# Patient Record
Sex: Female | Born: 1990 | Hispanic: Yes | Marital: Single | Smoking: Never smoker
Health system: Southern US, Community
[De-identification: ages and names within clinical notes are randomized; demographics above are authoritative.]

## PROBLEM LIST (undated history)

## (undated) ENCOUNTER — Inpatient Hospital Stay: Payer: Self-pay

## (undated) HISTORY — PX: NO PAST SURGERIES: SHX2092

---
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
## 2008-11-06 ENCOUNTER — Emergency Department: Payer: Self-pay | Admitting: Unknown Physician Specialty

## 2011-05-26 ENCOUNTER — Emergency Department: Payer: Self-pay | Admitting: Emergency Medicine

## 2012-12-07 ENCOUNTER — Emergency Department: Payer: Self-pay | Admitting: Emergency Medicine

## 2012-12-07 LAB — BASIC METABOLIC PANEL
Anion Gap: 9 (ref 7–16)
BUN: 9 mg/dL (ref 7–18)
Calcium, Total: 9.1 mg/dL (ref 8.5–10.1)
Co2: 25 mmol/L (ref 21–32)
Creatinine: 0.49 mg/dL — ABNORMAL LOW (ref 0.60–1.30)
EGFR (African American): >60
EGFR (Non-African Amer.): >60
Glucose: 96 mg/dL (ref 65–99)
Osmolality: 274 (ref 275–301)
Potassium: 3.3 mmol/L — ABNORMAL LOW (ref 3.5–5.1)
Sodium: 138 mmol/L (ref 136–145)

## 2012-12-07 LAB — CBC WITH DIFFERENTIAL/PLATELET
Basophil #: 0 10*3/uL (ref 0.0–0.1)
Basophil %: 0.4 %
Eosinophil %: 0.3 %
HCT: 35.9 % (ref 35.0–47.0)
Lymphocyte %: 18.9 %
MCHC: 34.9 g/dL (ref 32.0–36.0)
Monocyte #: 0.6 x10 3/mm (ref 0.2–0.9)
Monocyte %: 4.9 %
Neutrophil %: 75.5 %
RBC: 3.99 10*6/uL (ref 3.80–5.20)
RDW: 12.8 % (ref 11.5–14.5)

## 2012-12-08 LAB — URINALYSIS, COMPLETE
Bilirubin,UR: NEGATIVE
Leukocyte Esterase: NEGATIVE
Nitrite: NEGATIVE
Protein: NEGATIVE
Specific Gravity: 1.009 (ref 1.003–1.030)
Squamous Epithelial: <1
WBC UR: <1 /HPF (ref 0–5)

## 2012-12-08 LAB — MONONUCLEOSIS SCREEN: Mono Test: NEGATIVE

## 2012-12-08 LAB — RAPID INFLUENZA A&B ANTIGENS

## 2014-06-22 ENCOUNTER — Emergency Department: Payer: Self-pay | Admitting: Emergency Medicine

## 2014-06-22 LAB — CBC WITH DIFFERENTIAL/PLATELET
Basophil #: 0 10*3/uL (ref 0.0–0.1)
Basophil %: 0.3 %
Eosinophil #: 0.2 10*3/uL (ref 0.0–0.7)
Eosinophil %: 1.6 %
HCT: 36 % (ref 35.0–47.0)
HGB: 11.9 g/dL — AB (ref 12.0–16.0)
LYMPHS ABS: 3.5 10*3/uL (ref 1.0–3.6)
Lymphocyte %: 30.6 %
MCH: 29.3 pg (ref 26.0–34.0)
MCHC: 33.1 g/dL (ref 32.0–36.0)
MCV: 88 fL (ref 80–100)
Monocyte #: 0.6 x10 3/mm (ref 0.2–0.9)
Monocyte %: 5.7 %
Neutrophil #: 7.1 10*3/uL — ABNORMAL HIGH (ref 1.4–6.5)
Neutrophil %: 61.8 %
Platelet: 390 10*3/uL (ref 150–440)
RBC: 4.08 10*6/uL (ref 3.80–5.20)
RDW: 14.4 % (ref 11.5–14.5)
WBC: 11.4 10*3/uL — ABNORMAL HIGH (ref 3.6–11.0)

## 2014-06-22 LAB — URINALYSIS, COMPLETE
BACTERIA: NONE SEEN
BLOOD: NEGATIVE
Bilirubin,UR: NEGATIVE
GLUCOSE, UR: NEGATIVE mg/dL (ref 0–75)
Ketone: NEGATIVE
Leukocyte Esterase: NEGATIVE
NITRITE: NEGATIVE
PROTEIN: NEGATIVE
Ph: 6 (ref 4.5–8.0)
RBC,UR: 1 /HPF (ref 0–5)
Specific Gravity: 1.012 (ref 1.003–1.030)
Squamous Epithelial: 3
WBC UR: <1 /HPF (ref 0–5)

## 2014-06-22 LAB — COMPREHENSIVE METABOLIC PANEL
ALBUMIN: 4 g/dL (ref 3.4–5.0)
Alkaline Phosphatase: 72 U/L
Anion Gap: 6 — ABNORMAL LOW (ref 7–16)
BUN: 9 mg/dL (ref 7–18)
Bilirubin,Total: 0.4 mg/dL (ref 0.2–1.0)
CALCIUM: 8.6 mg/dL (ref 8.5–10.1)
Chloride: 104 mmol/L (ref 98–107)
Co2: 26 mmol/L (ref 21–32)
Creatinine: 0.5 mg/dL — ABNORMAL LOW (ref 0.60–1.30)
EGFR (African American): >60
Glucose: 89 mg/dL (ref 65–99)
OSMOLALITY: 270 (ref 275–301)
Potassium: 3.5 mmol/L (ref 3.5–5.1)
SGOT(AST): 19 U/L (ref 15–37)
SGPT (ALT): 24 U/L
Sodium: 136 mmol/L (ref 136–145)
Total Protein: 8.1 g/dL (ref 6.4–8.2)

## 2016-04-20 ENCOUNTER — Other Ambulatory Visit: Payer: Self-pay | Admitting: Obstetrics and Gynecology

## 2016-04-20 DIAGNOSIS — Z369 Encounter for antenatal screening, unspecified: Secondary | ICD-10-CM

## 2016-05-04 ENCOUNTER — Other Ambulatory Visit: Payer: Self-pay | Admitting: Obstetrics and Gynecology

## 2016-05-04 ENCOUNTER — Encounter: Payer: Self-pay | Admitting: *Deleted

## 2016-05-04 ENCOUNTER — Ambulatory Visit
Admission: RE | Admit: 2016-05-04 | Discharge: 2016-05-04 | Disposition: A | Discharge status: Home / Self Care | Payer: Medicaid Other | Source: Ambulatory Visit | Attending: Obstetrics and Gynecology | Admitting: Obstetrics and Gynecology

## 2016-05-04 DIAGNOSIS — O283 Abnormal ultrasonic finding on antenatal screening of mother: Secondary | ICD-10-CM | POA: Insufficient documentation

## 2016-05-04 DIAGNOSIS — Z369 Encounter for antenatal screening, unspecified: Secondary | ICD-10-CM

## 2016-05-04 DIAGNOSIS — O3481 Maternal care for other abnormalities of pelvic organs, first trimester: Secondary | ICD-10-CM | POA: Insufficient documentation

## 2016-05-04 DIAGNOSIS — Z3A11 11 weeks gestation of pregnancy: Secondary | ICD-10-CM | POA: Insufficient documentation

## 2016-05-04 NOTE — Progress Notes (Addendum)
[facility]  [facility] Length of Consultation: 45 minutes   Ms. Monica Stevens  [facility] to review prenatal screening and testing options.  This note summarizes the information we discussed.    We offered the following routine screening tests for this pregnancy:  First trimester screening, which includes nuchal translucency ultrasound screen and first trimester maternal serum marker screening.  The nuchal translucency has approximately an 80% detection rate for Down syndrome and can be positive for other chromosome abnormalities as well as congenital heart defects.  When combined with a maternal serum marker screening, the detection rate is up to 90% for Down syndrome and up to 97% for trisomy 18.     Maternal serum marker screening, a blood test that measures pregnancy proteins, can provide risk assessments for Down syndrome, trisomy 18, and open neural tube defects (spina bifida, anencephaly). Because it does not directly examine the fetus, it cannot positively diagnose or rule out these problems.  Targeted ultrasound uses high frequency sound waves to create an image of the developing fetus.  An ultrasound is often recommended as a routine means of evaluating the pregnancy.  It is also used to screen for fetal anatomy problems (for example, a heart defect) that might be suggestive of a chromosomal or other abnormality.   Should these screening tests indicate an increased concern, then the following additional testing options would be offered:  The chorionic villus sampling procedure is available for first trimester chromosome analysis.  This involves the withdrawal of a small amount of chorionic villi (tissue from the developing placenta).  Risk of pregnancy loss is estimated to be approximately 1 in 200 to 1 in 100 (0.5 to 1%).  There is approximately a 1% (1 in 100) chance that the CVS chromosome results will be  unclear.  Chorionic villi cannot be tested for neural tube defects.     Amniocentesis involves the removal of a small amount of amniotic fluid from the sac surrounding the fetus with the use of a thin needle inserted through the maternal abdomen and uterus.  Ultrasound guidance is used throughout the procedure.  Fetal cells from amniotic fluid are directly evaluated and > 99.5% of chromosome problems and > 98% of open neural tube defects can be detected. This procedure is generally performed after the 15th week of pregnancy.  The main risks to this procedure include complications leading to miscarriage in less than 1 in 200 cases (0.5%).  As another option for information if the pregnancy is suspected to be an an increased chance for certain chromosome conditions, we also reviewed the availability of cell free fetal DNA testing from maternal blood to determine whether or not the baby may have either Down syndrome, trisomy 3413, or trisomy 6418.  This test utilizes a maternal blood sample and DNA sequencing technology to isolate circulating cell free fetal DNA from maternal plasma.  The fetal DNA can then be analyzed for DNA sequences that are derived from the three most common chromosomes involved in aneuploidy, chromosomes 13, 18, and 21.  If the overall amount of DNA is greater than the expected level for any of these chromosomes, aneuploidy is suspected.  While we do not consider it a replacement for invasive testing and karyotype analysis, a negative result from this testing would be reassuring, though not a guarantee of a normal chromosome complement for the baby.  An abnormal result is certainly suggestive of an abnormal chromosome complement, though  we would still recommend CVS or amniocentesis to confirm any findings from this testing.  Cystic Fibrosis and Spinal Muscular Atrophy (SMA) screening were also discussed with the patient. Both conditions are recessive, which means that both parents must be  carriers in order to have a child with the disease.  Cystic fibrosis (CF) is one of the most common genetic conditions in persons of Caucasian ancestry.  This condition occurs in approximately 1 in 2,500 Caucasian persons and results in thickened secretions in the lungs, digestive, and reproductive systems.  For a baby to be at risk for having CF, both of the parents must be carriers for this condition.  Approximately 1 in 101 Caucasian persons is a carrier for CF.  Current carrier testing looks for the most common mutations in the gene for CF and can detect approximately 90% of carriers in the Caucasian population.  This means that the carrier screening can greatly reduce, but cannot eliminate, the chance for an individual to have a child with CF.  If an individual is found to be a carrier for CF, then carrier testing would be available for the partner. As part of [place]'s newborn screening profile, all babies born in the state of [place] will have a two-tier screening process.  Specimens are first tested to determine the concentration of immunoreactive trypsinogen (IRT).  The top 5% of specimens with the highest IRT values then undergo DNA testing using a panel of over 40 common CF mutations. SMA is a neurodegenerative disorder that leads to atrophy of skeletal muscle and overall weakness.  This condition is also more prevalent in the Caucasian population, with 1 in 40-1 in 60 persons being a carrier and 1 in 6,000-1 in 10,000 children being affected.  There are multiple forms of the disease, with some causing death in infancy to other forms with survival into adulthood.  The genetics of SMA is complex, but carrier screening can detect up to 95% of carriers in the Caucasian population.  Similar to CF, a negative result can greatly reduce, but cannot eliminate, the chance to have a child with SMA.  The patient declined CF and SMA carrier testing.  A detailed family history was obtained. The patient  shared that her mother was diagnosed with breast cancer at the age of 27 and has had a benign ovarian tumor. She said she thinks that genetic testing was done, but she was not aware of the results. There is no other family history of multiple cancers or cancers diagnosed at a young age. The patient has not had any surveillance or genetic testing for breast cancer, but was encouraged to follow through with screening recommendations as directed by her healthcare provider.  If she would like to speak with a cancer genetic counselor regarding this history or her mother's possible testing, we are happy to provide contact information for a cancer genetic counselor.   Additionally, the patient noted that her sister has some hearing loss that resulted from a traumatic injury followed by recurrent ear infections and vision loss due to toxoplasmosis .  These would not be expected to be inherited. The patient did not identify anyone else in the family with deafness or blindness from a young age, muscle weakness, blood disorders, intellectual disability, birth defects, known genetic conditions, or three or more miscarriages. Consanguinity was denied.   Ms. Monica Stevens stated that this is her second pregnancy, the first with this partner.  She has a healthy 27 year old son.  She  reported no complications or exposures that would be expected to increase the risk for birth defects.  After consideration of the options, Ms. Monica Stevens elected to proceed with an ultrasound and first trimester screening.  An ultrasound was performed at the time of the visit.  The gestational age was consistent with 11 weeks, however, the CRL was 43mm, which is too small to proceed with first trimester screening today.  In addition, the nuchal region measured 3.62mm, which is above the 95th percentile for 11-[redacted] weeks gestation.  Fetal anatomy could not be assessed due to early gestational age.  Please refer to the ultrasound report for details of  that study.  Given the edematous posterior neck region, we offered cell free DNA testing and CVS as options as well as follow up ultrasound in 1 week at the [facility].  The couple elected to go to [facility] on 10/9 at 8:30am for an ultrasound and can determine if they want any additional testing at that time.  Ms. Monica Stevens was encouraged to call with questions or concerns.  We can be contacted at [phone].   Cherly Anderson, MS, CGC I saw the pt and reviewed the ultrasound findings and my concern for the thickness on the back of the fetal neck  She was too early for first tri screen , we offered cell free DNA and referral to [facility] for consideration of diagnostic testing  Jimmey Ralph, MD

## 2016-06-18 ENCOUNTER — Other Ambulatory Visit: Payer: Self-pay | Admitting: Obstetrics and Gynecology

## 2016-06-18 DIAGNOSIS — Z3689 Encounter for other specified antenatal screening: Secondary | ICD-10-CM

## 2016-07-06 ENCOUNTER — Ambulatory Visit
Admission: RE | Admit: 2016-07-06 | Discharge: 2016-07-06 | Disposition: A | Discharge status: Home / Self Care | Payer: Medicaid Other | Source: Ambulatory Visit | Attending: Maternal & Fetal Medicine | Admitting: Maternal & Fetal Medicine

## 2016-07-06 DIAGNOSIS — Z3689 Encounter for other specified antenatal screening: Secondary | ICD-10-CM | POA: Insufficient documentation

## 2016-07-06 DIAGNOSIS — O283 Abnormal ultrasonic finding on antenatal screening of mother: Secondary | ICD-10-CM

## 2016-07-06 DIAGNOSIS — Z3A20 20 weeks gestation of pregnancy: Secondary | ICD-10-CM | POA: Insufficient documentation

## 2016-07-13 ENCOUNTER — Ambulatory Visit: Payer: Medicaid Other

## 2016-08-03 NOTE — L&D Delivery Note (Addendum)
Delivery Note At 2:15 PM a viable  female was delivered via Vaginal, Spontaneous Delivery straight OA and restituted to LOA with no CAN. Vtx, ant and post shoulder and body del at 1415, baby to mom's abd and delayed cord clamping done, CCx2 and cut per dad, SDOP intact a 1420,Cord blood collected and sent.  APGAR:8/9 , ; weight    Placenta status: SDOP intact at 1420. FF and lochia mod, no clots. . 3 VC Cord. No lacs, no needles and no sponges used. VSS. Hemostasis achieved. Bonding with baby.  Anesthesia:  none Episiotomy:  none Lacerations:  none Suture Repair:none Est. Blood Loss (mL):   Mom to PP when recovered.  Baby to skin to skin for bonding.   [name] 11/20/2016, 2:25 PM

## 2016-11-16 ENCOUNTER — Observation Stay
Admission: EM | Admit: 2016-11-16 | Discharge: 2016-11-16 | Disposition: A | Discharge status: Home / Self Care | Payer: Medicaid Other | Attending: Obstetrics & Gynecology | Admitting: Obstetrics & Gynecology

## 2016-11-16 DIAGNOSIS — O368130 Decreased fetal movements, third trimester, not applicable or unspecified: Secondary | ICD-10-CM | POA: Diagnosis present

## 2016-11-16 DIAGNOSIS — O283 Abnormal ultrasonic finding on antenatal screening of mother: Secondary | ICD-10-CM

## 2016-11-16 DIAGNOSIS — Z3483 Encounter for supervision of other normal pregnancy, third trimester: Principal | ICD-10-CM | POA: Insufficient documentation

## 2016-11-16 NOTE — Progress Notes (Signed)
Spoke to Dr. Elesa Massed in department and ordered to send pt home if no cervical change in 1 hour. Told not to call if pt was not changed and go ahead and send her home, told to call if cervical change noted.

## 2016-11-16 NOTE — OB Triage Note (Signed)
Pt G2P1 [redacted]w[redacted]d complains of decreased fetal movement since this afternoon after lunch. Pt states she has been eating and drinking fluid. Pt states some leaking of fluid/discharge that is thin consistency and clear. Nitrazine negative. VSS.

## 2016-11-17 NOTE — Discharge Summary (Signed)
Progress Note Decreased Fetal Movement.  Subjective:   Monica Stevens is a 26 y.o. female. She is at [redacted]w[redacted]d gestation. She has noted decreased fetal movement for the last several hours.  She also reports occasional contractions and denies bleeding, contractions, cramping or leaking. Her pregnancy has been complicated by: elevated NT measurement, declined genetic screening Receives care @ [facility], [facility], [facility] and problem list reviewed Medications and allergies reviewed.     Family history: no gyn cancers  Social History   Social History  . Marital status: Single    Spouse name: N/A  . Number of children: N/A  . Years of education: N/A   Occupational History  . Not on file.   Social History Main Topics  . Smoking status: Never Smoker  . Smokeless tobacco: Never Used  . Alcohol use No  . Drug use: No  . Sexual activity: Yes    Birth control/ protection: Other-see comments     Comment: yes - still considering options   Other Topics Concern  . Not on file   Social History Narrative  . No narrative on file     Objective:    BP 119/63 (BP Location: Left Arm)   Pulse 90   Temp 98.7 F (37.1 C) (Oral)   Resp 16   Ht  (1.549 m)   Wt 84.4 kg (186 lb)   LMP 02/08/2016   BMI 35.14 kg/m   General appearance: alert, well appearing, and in no distress. External fetal monitoring:  135 mod + accels no decels   Assessment:   Pregnancy at [redacted]w[redacted]d with concerns for decreased fetal movement. Evaluation reveals: r return to normal fetal movement since admission, no further cervical dilation NST reactive   Plan:   D/C home, keep next scheduled appointment, or return for decreased fetal movement   Patient expresses understanding of information provided and plan of care.     ----- Ranae Plumber, MD Attending Obstetrician and Gynecologist [facility], Department of OB/GYN [facility] [address]

## 2016-11-19 ENCOUNTER — Encounter: Payer: Self-pay | Admitting: *Deleted

## 2016-11-19 ENCOUNTER — Inpatient Hospital Stay
Admission: EM | Admit: 2016-11-19 | Discharge: 2016-11-20 | Disposition: A | Discharge status: Home / Self Care | Payer: Medicaid Other | Source: Home / Self Care | Admitting: Obstetrics and Gynecology

## 2016-11-19 DIAGNOSIS — Z3493 Encounter for supervision of normal pregnancy, unspecified, third trimester: Secondary | ICD-10-CM

## 2016-11-19 MED ORDER — ACETAMINOPHEN 325 MG PO TABS: 650.0000 mg | ORAL_TABLET | ORAL | Status: DC | PRN

## 2016-11-19 MED ORDER — OXYCODONE-ACETAMINOPHEN 5-325 MG PO TABS: 1.0000 | ORAL_TABLET | Freq: Once | ORAL | Status: DC

## 2016-11-19 MED ORDER — ZOLPIDEM TARTRATE 5 MG PO TABS
5.0000 mg | ORAL_TABLET | Freq: Once | ORAL | Status: AC
  Administered 2016-11-19: 5 mg via ORAL
  Filled 2016-11-19: qty 1

## 2016-11-19 MED ORDER — MORPHINE SULFATE (PF) 10 MG/ML IV SOLN
10.0000 mg | Freq: Once | INTRAVENOUS | Status: AC
  Administered 2016-11-19: 10 mg via INTRAVENOUS
  Filled 2016-11-19: qty 1

## 2016-11-19 MED ORDER — CALCIUM CARBONATE ANTACID 500 MG PO CHEW: 2.0000 | CHEWABLE_TABLET | ORAL | Status: DC | PRN

## 2016-11-19 NOTE — OB Triage Provider Note (Signed)
TRIAGE VISIT with NST   Monica Stevens is a 26 y.o. G2P0. She is at [redacted]w[redacted]d gestation, presenting with signs of labor.  Indication: Contractions  S: Resting comfortably. Occ CTX, no VB. Active fetal movement. O:  BP 126/70 (BP Location: Left Arm)   Pulse 92   Temp 98.1 F (36.7 C) (Oral)   Resp 18   Ht  (1.549 m)   Wt 186 lb (84.4 kg)   LMP 02/08/2016   BMI 35.14 kg/m  No results found for this or any previous visit (from the past 48 hour(s)).   Gen: NAD, AAOx3      Abd: FNTTP      Ext: Non-tender, Nonedmeatous    NST/FHT: 145, mod var, +accels, no decels TOCO: quiet ZOX:[id number]: 3.5 Effacement (%): 70, 80 Cervical Position: Middle Station: -1 Exam by:: gck  NST: Reactive. See FHT above for particulars.  A/P:  26 y.o. G2P0 [redacted]w[redacted]d with ctx at term.   Labor: not present. Cervix unchanged from office.   Fetal Wellbeing: NST reactive Reassuring Cat 1 tracing.  D/c home stable, precautions reviewed, follow-up as scheduled.

## 2016-11-19 NOTE — OB Triage Note (Signed)
Pt. Presents with complaint of contractions starting last night and increasing in intensity and became closer today. Denies LOF but has some bloody discharge.

## 2016-11-19 NOTE — Progress Notes (Signed)
Patient ID: Monica Stevens, female   DOB: November 24, 1990, 26 y.o.   MRN: [id number]  Pt contractions have spaced but are still strong and worrisome to her. Cervix made change over several hours from 3.5 to 4cm. Reactive NST and Cat I strip.   Vitals:   11/19/16 1812 11/19/16 1952  BP: 126/70 (!) 117/94  Pulse: 92 98  Resp: 18   Temp: 98.1 F (36.7 C) 98.3 F (36.8 C)   FHT: 140, mod var, =accels, no decels  A: Early labor P: Morphine  IM and 10 mg po ambien for morphine rest. Will recheck when wakes and if Cat I strip and no change, plan for d/c home. If labor, will admit.

## 2016-11-20 ENCOUNTER — Inpatient Hospital Stay
Admission: EM | Admit: 2016-11-20 | Discharge: 2016-11-22 | DRG: 775 | Disposition: A | Discharge status: Home / Self Care | Payer: Medicaid Other | Attending: Obstetrics and Gynecology | Admitting: Obstetrics and Gynecology

## 2016-11-20 ENCOUNTER — Inpatient Hospital Stay: Payer: Medicaid Other | Admitting: Anesthesiology

## 2016-11-20 DIAGNOSIS — Z3493 Encounter for supervision of normal pregnancy, unspecified, third trimester: Secondary | ICD-10-CM | POA: Diagnosis present

## 2016-11-20 DIAGNOSIS — Z3490 Encounter for supervision of normal pregnancy, unspecified, unspecified trimester: Secondary | ICD-10-CM

## 2016-11-20 DIAGNOSIS — Z3A39 39 weeks gestation of pregnancy: Secondary | ICD-10-CM

## 2016-11-20 DIAGNOSIS — O99824 Streptococcus B carrier state complicating childbirth: Principal | ICD-10-CM | POA: Diagnosis present

## 2016-11-20 DIAGNOSIS — O283 Abnormal ultrasonic finding on antenatal screening of mother: Secondary | ICD-10-CM

## 2016-11-20 LAB — CBC
HEMATOCRIT: 35.2 % (ref 35.0–47.0)
Hemoglobin: 11.7 g/dL — ABNORMAL LOW (ref 12.0–16.0)
MCH: 29 pg (ref 26.0–34.0)
MCHC: 33.1 g/dL (ref 32.0–36.0)
MCV: 87.4 fL (ref 80.0–100.0)
Platelets: 370 10*3/uL (ref 150–440)
RBC: 4.03 MIL/uL (ref 3.80–5.20)
RDW: 15.2 % — AB (ref 11.5–14.5)
WBC: 22.7 10*3/uL — AB (ref 3.6–11.0)

## 2016-11-20 LAB — TYPE AND SCREEN
ABO/RH(D): O POS
Antibody Screen: NEGATIVE

## 2016-11-20 MED ORDER — ACETAMINOPHEN 325 MG PO TABS
650.0000 mg | ORAL_TABLET | ORAL | Status: DC | PRN
  Administered 2016-11-21 – 2016-11-22 (×3): 650 mg via ORAL
  Filled 2016-11-20 (×3): qty 2

## 2016-11-20 MED ORDER — EPHEDRINE 5 MG/ML INJ
10.0000 mg | INTRAVENOUS | Status: DC | PRN
  Filled 2016-11-20: qty 2

## 2016-11-20 MED ORDER — SODIUM CHLORIDE 0.9% FLUSH: 3.0000 mL | INTRAVENOUS | Status: DC | PRN

## 2016-11-20 MED ORDER — ACETAMINOPHEN 325 MG PO TABS
650.0000 mg | ORAL_TABLET | Freq: Four times a day (QID) | ORAL | Status: DC | PRN
  Administered 2016-11-20: 650 mg via ORAL

## 2016-11-20 MED ORDER — DIPHENHYDRAMINE HCL 25 MG PO CAPS: 25.0000 mg | ORAL_CAPSULE | Freq: Four times a day (QID) | ORAL | Status: DC | PRN

## 2016-11-20 MED ORDER — ONDANSETRON HCL 4 MG/2ML IJ SOLN
4.0000 mg | Freq: Four times a day (QID) | INTRAMUSCULAR | Status: DC | PRN
  Administered 2016-11-20: 4 mg via INTRAVENOUS
  Filled 2016-11-20: qty 2

## 2016-11-20 MED ORDER — FLEET ENEMA 7-19 GM/118ML RE ENEM: 1.0000 | ENEMA | Freq: Every day | RECTAL | Status: DC | PRN

## 2016-11-20 MED ORDER — SENNOSIDES-DOCUSATE SODIUM 8.6-50 MG PO TABS
2.0000 | ORAL_TABLET | ORAL | Status: DC
  Administered 2016-11-21 – 2016-11-22 (×2): 2 via ORAL
  Filled 2016-11-20 (×2): qty 2

## 2016-11-20 MED ORDER — FENTANYL 2.5 MCG/ML W/ROPIVACAINE 0.2% IN NS 100 ML EPIDURAL INFUSION (ARMC-ANES)
10.0000 mL/h | EPIDURAL | Status: DC
  Administered 2016-11-20: 10 mL/h via EPIDURAL
  Filled 2016-11-20: qty 100

## 2016-11-20 MED ORDER — PHENYLEPHRINE 40 MCG/ML (10ML) SYRINGE FOR IV PUSH (FOR BLOOD PRESSURE SUPPORT)
80.0000 ug | PREFILLED_SYRINGE | INTRAVENOUS | Status: DC | PRN
  Filled 2016-11-20: qty 5

## 2016-11-20 MED ORDER — DIBUCAINE 1 % RE OINT: 1.0000 "application " | TOPICAL_OINTMENT | RECTAL | Status: DC | PRN

## 2016-11-20 MED ORDER — ACETAMINOPHEN 325 MG PO TABS
ORAL_TABLET | ORAL | Status: AC
  Administered 2016-11-20: 650 mg via ORAL
  Filled 2016-11-20: qty 2

## 2016-11-20 MED ORDER — OXYTOCIN 40 UNITS IN LACTATED RINGERS INFUSION - SIMPLE MED
INTRAVENOUS | Status: AC
  Filled 2016-11-20: qty 1000

## 2016-11-20 MED ORDER — PRENATAL MULTIVITAMIN CH
1.0000 | ORAL_TABLET | Freq: Every day | ORAL | Status: DC
  Administered 2016-11-21 – 2016-11-22 (×2): 1 via ORAL
  Filled 2016-11-20 (×2): qty 1

## 2016-11-20 MED ORDER — ONDANSETRON HCL 4 MG PO TABS: 4.0000 mg | ORAL_TABLET | ORAL | Status: DC | PRN

## 2016-11-20 MED ORDER — COCONUT OIL OIL
1.0000 "application " | TOPICAL_OIL | Status: DC | PRN
  Filled 2016-11-20: qty 120

## 2016-11-20 MED ORDER — OXYTOCIN 10 UNIT/ML IJ SOLN
INTRAMUSCULAR | Status: AC
  Filled 2016-11-20: qty 2

## 2016-11-20 MED ORDER — AMPICILLIN SODIUM 2 G IJ SOLR
INTRAMUSCULAR | Status: AC
  Filled 2016-11-20: qty 2000

## 2016-11-20 MED ORDER — MEASLES, MUMPS & RUBELLA VAC ~~LOC~~ INJ
0.5000 mL | INJECTION | Freq: Once | SUBCUTANEOUS | Status: DC
  Filled 2016-11-20: qty 0.5

## 2016-11-20 MED ORDER — WITCH HAZEL-GLYCERIN EX PADS: 1.0000 "application " | MEDICATED_PAD | CUTANEOUS | Status: DC | PRN

## 2016-11-20 MED ORDER — TETANUS-DIPHTH-ACELL PERTUSSIS 5-2.5-18.5 LF-MCG/0.5 IM SUSP: 0.5000 mL | Freq: Once | INTRAMUSCULAR | Status: DC

## 2016-11-20 MED ORDER — IBUPROFEN 600 MG PO TABS
600.0000 mg | ORAL_TABLET | Freq: Four times a day (QID) | ORAL | Status: DC
  Filled 2016-11-20: qty 1

## 2016-11-20 MED ORDER — MISOPROSTOL 200 MCG PO TABS
ORAL_TABLET | ORAL | Status: AC
  Filled 2016-11-20: qty 4

## 2016-11-20 MED ORDER — LACTATED RINGERS IV SOLN: 500.0000 mL | INTRAVENOUS | Status: DC | PRN

## 2016-11-20 MED ORDER — FENTANYL 2.5 MCG/ML W/ROPIVACAINE 0.2% IN NS 100 ML EPIDURAL INFUSION (ARMC-ANES)
EPIDURAL | Status: AC
  Filled 2016-11-20: qty 100

## 2016-11-20 MED ORDER — ONDANSETRON HCL 4 MG/2ML IJ SOLN: 4.0000 mg | INTRAMUSCULAR | Status: DC | PRN

## 2016-11-20 MED ORDER — LIDOCAINE HCL (PF) 1 % IJ SOLN
INTRAMUSCULAR | Status: AC
  Filled 2016-11-20: qty 30

## 2016-11-20 MED ORDER — AMMONIA AROMATIC IN INHA
RESPIRATORY_TRACT | Status: AC
  Filled 2016-11-20: qty 10

## 2016-11-20 MED ORDER — IBUPROFEN 600 MG PO TABS
600.0000 mg | ORAL_TABLET | Freq: Four times a day (QID) | ORAL | Status: DC
  Administered 2016-11-20 – 2016-11-22 (×8): 600 mg via ORAL
  Filled 2016-11-20 (×7): qty 1

## 2016-11-20 MED ORDER — BUPIVACAINE HCL (PF) 0.25 % IJ SOLN
INTRAMUSCULAR | Status: DC | PRN
  Administered 2016-11-20: 5 mL via EPIDURAL

## 2016-11-20 MED ORDER — SIMETHICONE 80 MG PO CHEW: 80.0000 mg | CHEWABLE_TABLET | ORAL | Status: DC | PRN

## 2016-11-20 MED ORDER — SODIUM CHLORIDE 0.9% FLUSH: 3.0000 mL | Freq: Two times a day (BID) | INTRAVENOUS | Status: DC

## 2016-11-20 MED ORDER — BENZOCAINE-MENTHOL 20-0.5 % EX AERO
1.0000 "application " | INHALATION_SPRAY | CUTANEOUS | Status: DC | PRN
  Filled 2016-11-20: qty 56

## 2016-11-20 MED ORDER — SODIUM CHLORIDE 0.9 % IV SOLN: 250.0000 mL | INTRAVENOUS | Status: DC | PRN

## 2016-11-20 MED ORDER — LACTATED RINGERS IV SOLN
INTRAVENOUS | Status: DC
  Administered 2016-11-20 (×2): via INTRAVENOUS

## 2016-11-20 MED ORDER — DIPHENHYDRAMINE HCL 50 MG/ML IJ SOLN: 12.5000 mg | INTRAMUSCULAR | Status: DC | PRN

## 2016-11-20 MED ORDER — SODIUM CHLORIDE 0.9 % IV SOLN
2.0000 g | Freq: Once | INTRAVENOUS | Status: AC
  Administered 2016-11-20: 2 g via INTRAVENOUS
  Filled 2016-11-20: qty 2000

## 2016-11-20 MED ORDER — BISACODYL 10 MG RE SUPP: 10.0000 mg | Freq: Every day | RECTAL | Status: DC | PRN

## 2016-11-20 MED ORDER — ZOLPIDEM TARTRATE 5 MG PO TABS: 5.0000 mg | ORAL_TABLET | Freq: Every evening | ORAL | Status: DC | PRN

## 2016-11-20 MED ORDER — LACTATED RINGERS IV SOLN: 500.0000 mL | Freq: Once | INTRAVENOUS | Status: DC

## 2016-11-20 MED ORDER — AMPICILLIN SODIUM 1 G IJ SOLR
1.0000 g | INTRAMUSCULAR | Status: DC
  Administered 2016-11-20: 1 g via INTRAVENOUS
  Filled 2016-11-20 (×6): qty 1000

## 2016-11-20 NOTE — Anesthesia Preprocedure Evaluation (Addendum)
Anesthesia Evaluation  Patient identified by MRN, date of birth, ID band Patient awake    Reviewed: Allergy & Precautions, NPO status , Patient's Chart, lab work & pertinent test results, reviewed documented beta blocker date and time   Airway Mallampati: III  TM Distance: >3 FB     Dental  (+) Chipped   Pulmonary           Cardiovascular      Neuro/Psych    GI/Hepatic   Endo/Other    Renal/GU      Musculoskeletal   Abdominal   Peds  Hematology   Anesthesia Other Findings Wbc 22000. No fever. On antibiotics. Feel safe proceeding.  Reproductive/Obstetrics                            Anesthesia Physical Anesthesia Plan  ASA: II  Anesthesia Plan: Epidural   Post-op Pain Management:    Induction:   Airway Management Planned:   Additional Equipment:   Intra-op Plan:   Post-operative Plan:   Informed Consent: I have reviewed the patients History and Physical, chart, labs and discussed the procedure including the risks, benefits and alternatives for the proposed anesthesia with the patient or authorized representative who has indicated his/her understanding and acceptance.     Plan Discussed with: CRNA  Anesthesia Plan Comments:         Anesthesia Quick Evaluation

## 2016-11-20 NOTE — Anesthesia Procedure Notes (Signed)
Epidural Patient location during procedure: OB  Staffing Anesthesiologist: Berdine Addison Performed: anesthesiologist   Preanesthetic Checklist Completed: patient identified, site marked, surgical consent, pre-op evaluation, timeout performed, IV checked, risks and benefits discussed and monitors and equipment checked  Epidural Patient position: sitting Prep: Betadine Patient monitoring: heart rate, continuous pulse ox and blood pressure Approach: midline Location: L4-L5 Injection technique: LOR saline  Needle:  Needle type: Tuohy  Needle gauge: 18 G Needle length: 9 cm and 9 Catheter type: closed end flexible Catheter size: 20 Guage Test dose: negative and 1.5% lidocaine with Epi 1:200 K  Assessment Sensory level: T10 Events: blood not aspirated, injection not painful, no injection resistance, negative IV test and no paresthesia  Additional Notes   Patient tolerated the insertion well without complications.  0722 bolus. 0724 infusion.Reason for block:procedure for pain

## 2016-11-20 NOTE — Progress Notes (Signed)
Monica Stevens is a 26 y.o. G2P0 at [redacted]w[redacted]d by admitted for labor S/S.   Subjective: Feeling some pressure  Objective: BP 127/62   Pulse 83   Temp 98.2 F (36.8 C) (Oral)   Resp 18   Ht  (1.549 m)   Wt 84.4 kg (186 lb)   LMP 02/08/2016   SpO2 98%   BMI 35.14 kg/m  No intake/output data recorded. No intake/output data recorded.  FHT:  145, Cat 2 1 variable noted UC:  q 3-4 mins, mod to palpation, lasting 45-60secs SVE:   Dilation: Lip/rim Effacement (%): 100 Station: -1 Exam by:: Milon Score CNM  Labs: Lab Results  Component Value Date   WBC 22.7 (H) 11/20/2016   HGB 11.7 (L) 11/20/2016   HCT 35.2 11/20/2016   MCV 87.4 11/20/2016   PLT 370 11/20/2016    Assessment / Plan: A:1. IUP at term 2. GBs pos P: 1. Antic SVD 2. Continue to monitor UC/FHT's 3. Continue epidrual  [name] 11/20/2016, 12:31 PM

## 2016-11-20 NOTE — H&P (Signed)
Monica Stevens is a 26 y.o. female presenting for labor symptoms today 0530am with active labor. PNC at [facility] significant for G2P1001 with LMP of 02/08/16 & EDD of 11/14/16  EDD of 11/22/16 by 11 1/7 wk [place]. Nuchal region measured 3.1 mm which was above the 95% for 11-[redacted] wk gestation. Parents declined [facility] further testing and anatomic survey was normal. Declined AFP also.  OB History    Gravida Para Term Preterm AB Living   2         1   SAB TAB Ectopic Multiple Live Births                 History reviewed. No pertinent past medical history.  History reviewed. No pertinent surgical history. Social History:  reports that she has never smoked. She has never used smokeless tobacco. She reports that she does not drink alcohol or use drugs. FMH: no cancers or problems noted    Maternal Diabetes: 122 Genetic Screening: NT in the 95% for upper normal (See above) Maternal Ultrasounds/Referrals:per above Fetal Ultrasounds or other Referrals: none Maternal Substance Abuse:neg Significant Maternal Medications:PNV Significant Maternal Lab Results:  Per above Other Comments:   Review of Systems  Constitutional: Negative.   HENT: Negative.   Eyes: Negative.   Respiratory: Negative.   Cardiovascular: Negative.   Gastrointestinal: Negative.   Genitourinary: Negative.   Musculoskeletal: Negative.   Skin: Negative.   Neurological: Negative.   Endo/Heme/Allergies: Negative.   Psychiatric/Behavioral: Negative.    History Dilation: 6.5 Effacement (%): 80 Station: -1 Exam by:: Monica Bores, RN Blood pressure 114/63, pulse 96, temperature 98.3 F (36.8 C), temperature source Oral, resp. rate 18, height  (1.549 m), weight 84.4 kg (186 lb), last menstrual period 02/08/2016, SpO2 98 %. Exam Physical Exam  Heart: S1S2, RRR, NO M/R?G. Lungs: CTA bilat, no W/R/R. Abd: Gravid, EFW 8#6oz cx:7-8/100/Forebag poss present per RN Extrems: REflexes  1+/0  Prenatal labs: ABO, Rh:  --/--/PENDING (04/20 0549) O pos Antibody: PENDING (04/20 0549) neg  Rubella:  Immune RPR:   NR HBsAg:   neg GBS:   pos  Assessment/Plan: A: 1. IUP at term 2. GBS pos P: 1. Antic SVD 2. GBS protocol 3. Continue Epidural  [name] 11/20/2016, 7:51 AM

## 2016-11-20 NOTE — Discharge Summary (Signed)
Patient discharged with instructions on follow up appointment, labor precautions, pain management, and when to seek medical attention. Patient verbalized understanding of discharge instructions. Patient discharged with significant other. Patient discharged in wheelchair because patient received sedative medications during her labor evaluation.

## 2016-11-21 LAB — CBC
HCT: 30.2 % — ABNORMAL LOW (ref 35.0–47.0)
HEMOGLOBIN: 10 g/dL — AB (ref 12.0–16.0)
MCH: 29.4 pg (ref 26.0–34.0)
MCHC: 33.1 g/dL (ref 32.0–36.0)
MCV: 88.7 fL (ref 80.0–100.0)
PLATELETS: 308 10*3/uL (ref 150–440)
RBC: 3.4 MIL/uL — AB (ref 3.80–5.20)
RDW: 15 % — ABNORMAL HIGH (ref 11.5–14.5)
WBC: 16 10*3/uL — AB (ref 3.6–11.0)

## 2016-11-21 LAB — RPR: RPR: NONREACTIVE

## 2016-11-21 NOTE — Anesthesia Postprocedure Evaluation (Signed)
Anesthesia Post Note  Patient: Monica Stevens  Procedure(s) Performed: * No procedures listed *  Patient location during evaluation: Mother Baby Anesthesia Type: Epidural Level of consciousness: awake and alert Pain management: pain level controlled Respiratory status: spontaneous breathing, nonlabored ventilation and respiratory function stable Cardiovascular status: stable Postop Assessment: no headache, no backache and epidural receding Anesthetic complications: no     Last Vitals:  Vitals:   11/21/16 1239 11/21/16 1618  BP: 112/64   Pulse: 84   Resp: 18   Temp: 36.8 C 36.9 C    Last Pain:  Vitals:   11/21/16 1618  TempSrc: Oral  PainSc:                  Ihor Meinzer K

## 2016-11-21 NOTE — Progress Notes (Addendum)
Post Partum Day 1 Subjective:   Objective: Blood pressure (!) 105/51, pulse 87, temperature 97.7 F (36.5 C), temperature source Oral, resp. rate 18, height  (1.549 m), weight 84.4 kg (186 lb), last menstrual period 02/08/2016, SpO2 100 %.  Physical Exam:  General:A,A& oriented x 3 Heart: S1S2, RRR, No M/R/G. Lungs: CTA bilat, no W/R/R. Lochia: appropriate Uterine Fundus: firm Incision: none DVT Evaluation: neg Homans Taking po well and voiding well.  Recent Labs  11/20/16 0549 11/21/16 0523  HGB 11.7* 10.0*  HCT 35.2 30.2*    Assessment/Plan: A:1. Normal PPD#1 P: DC in am   LOS: 1 day   Monica Stevens 11/21/2016, 6:41 AM

## 2016-11-21 NOTE — Discharge Instructions (Signed)
Care After Vaginal Delivery °Congratulations on your new baby!! ° °Refer to this sheet in the next few weeks. These discharge instructions provide you with information on caring for yourself after delivery. Your caregiver may also give you specific instructions. Your treatment has been planned according to the most current medical practices available, but problems sometimes occur. Call your caregiver if you have any problems or questions after you go home. ° °HOME CARE INSTRUCTIONS °· Take over-the-counter or prescription medicines only as directed by your caregiver or pharmacist. °· Do not drink alcohol, especially if you are breastfeeding or taking medicine to relieve pain. °· Do not chew or smoke tobacco. °· Do not use illegal drugs. °· Continue to use good perineal care. Good perineal care includes: °¨ Wiping your perineum from front to back. °¨ Keeping your perineum clean. °· Do not use tampons or douche until your caregiver says it is okay. °· Shower, wash your hair, and take tub baths as directed by your caregiver. °· Wear a well-fitting bra that provides breast support. °· Eat healthy foods. °· Drink enough fluids to keep your urine clear or pale yellow. °· Eat high-fiber foods such as whole grain cereals and breads, brown rice, beans, and fresh fruits and vegetables every day. These foods may help prevent or relieve constipation. °· Follow your caregiver's recommendations regarding resumption of activities such as climbing stairs, driving, lifting, exercising, or traveling. Specifically, no driving for two weeks, so that you are comfortable reacting quickly in an emergency. °· Talk to your caregiver about resuming sexual activities. Resumption of sexual activities is dependent upon your risk of infection, your rate of healing, and your comfort and desire to resume sexual activity. Usually we recommend waiting about six weeks, or until your bleeding stops and you are interested in sex. °· Try to have someone  help you with your household activities and your newborn for at least a few days after you leave the hospital. Even longer is better. °· Rest as much as possible. Try to rest or take a nap when your newborn is sleeping. Sleep deprivation can be very hard after delivery. °· Increase your activities gradually. °· Keep all of your scheduled postpartum appointments. It is very important to keep your scheduled follow-up appointments. At these appointments, your caregiver will be checking to make sure that you are healing physically and emotionally. ° °SEEK MEDICAL CARE IF:  °· You are passing large clots from your vagina.  °· You have a foul smelling discharge from your vagina. °· You have trouble urinating. °· You are urinating frequently. °· You have pain when you urinate. °· You have a change in your bowel movements. °· You have increasing redness, pain, or swelling near your vaginal incision (episiotomy) or vaginal tear. °· You have pus draining from your episiotomy or vaginal tear. °· Your episiotomy or vaginal tear is separating. °· You have painful, hard, or reddened breasts. °· You have a severe headache. °· You have blurred vision or see spots. °· You feel sad or depressed. °· You have thoughts of hurting yourself or your newborn. °· You have questions about your care, the care of your newborn, or medicines. °· You are dizzy or light-headed. °· You have a rash. °· You have nausea or vomiting. °· You were breastfeeding and have not had a menstrual period within 12 weeks after you stopped breastfeeding. °· You are not breastfeeding and have not had a menstrual period by the 12th week after delivery. °· You   have a fever. ° °SEEK IMMEDIATE MEDICAL CARE IF:  °· You have persistent pain. °· You have chest pain. °· You have shortness of breath. °· You faint. °· You have leg pain. °· You have stomach pain. °· Your vaginal bleeding saturates two or more sanitary pads in 1 hour. ° °MAKE SURE YOU:  °· Understand these  instructions. °· Will get help right away if you are not doing well or get worse. °·  °Document Released: 07/17/2000 Document Revised: 12/04/2013 Document Reviewed: 03/16/2012 ° °Exit[employer]® Patient Information ©2015 [employer]. This information is not intended to replace advice given to you by your health care provider. Make sure you discuss any questions you have with your health care provider. ° °

## 2016-11-21 NOTE — Discharge Summary (Signed)
Obstetric Discharge Summary Reason for Admission: Labor and UC's Prenatal Procedures: ultrasound Intrapartum Procedures: spontaneous vaginal delivery of viable female with no lacs Postpartum Procedures: None Complications-Operative and Postpartum: none Hemoglobin  Date Value Ref Range Status  11/21/2016 10.0 (L) 12.0 - 16.0 g/dL Final   HGB  Date Value Ref Range Status  06/22/2014 11.9 (L) 12.0 - 16.0 g/dL Final   HCT  Date Value Ref Range Status  11/21/2016 30.2 (L) 35.0 - 47.0 % Final  06/22/2014 36.0 35.0 - 47.0 % Final    Physical Exam:  General: alert, cooperative and appears stated age 26: appropriate Uterine Fundus: firm Incision: None DVT Evaluation: No evidence of DVT seen on physical exam.  Discharge Diagnoses: Term Pregnancy-delivered  Discharge Information: Date: 11/21/2016 Activity: pelvic rest Diet: routine Medications: PNV, Ibuprofen and Iron Condition: stable Instructions:pelvic rest x 6 weeks, no driving x 2 weeks Discharge to: home   Newborn Data: Live born female named " [place]   " Birth Weight: 7 lb 11.1 oz (3490 g) APGAR: 8, 9 Tale Fe over the counter Home with mom. FU at 6 weeks at [facility].   [name] 11/21/2016, 11:02 AM

## 2016-11-22 NOTE — Progress Notes (Signed)
D/C instructions provided, pt states understanding, aware of follow up appt.      

## 2016-11-22 NOTE — Progress Notes (Signed)
D/C home to car via auxiliary in wheelchair.  

## 2016-11-24 IMAGING — US US MFM OB COMPLETE +14 WKS
1 series · 13 of 28 positions shown · non-contrast
Comparison: none

PATIENT INFO:

DATABEX
PERFORMED BY:
SERVICE(S) PROVIDED:
US MFM OB COMP LESS THAN 14 WEEKS                     76801.4
INDICATIONS:
11 weeks gestation of pregnancy
FETAL EVALUATION:
Num Of Fetuses:     1
Yolk Sac:
Fetal Heart         158
Rate(bpm):
Cardiac Activity:   Present
Presentation:       Variable
Placenta:           Posterior
P. Cord Insertion:  Normal
BIOMETRY:
CRL:      43.1  mm     G. Age:  11w 1d                  EDD:    11/22/16
NFT:         3  mm
GESTATIONAL AGE:
LMP:           12w 2d        Date:  02/08/16                 EDD:   11/14/16
Best:          11w 1d     Det. By:  U/S C R L (05/04/16)     EDD:   11/22/16
ANATOMY:
Stomach:               Seen
Other:  Nuchal fold appears thickened.
CERVIX UTERUS ADNEXA:
Cervix
Length:           5.17  cm.
Left Ovary
Size(cm)       3.8  x   1.4    x  2.6       Vol(ml):
Right Ovary
Size(cm)       4.6  x   2.4    x  2.8       Vol(ml):
Cyst measuring 2.0 x 1.8 x 2.3cm

[Series 1: us mfm ob complete +14 wks · 0.19mm/px · 13 of 112 slices shown]
[im 5/112]
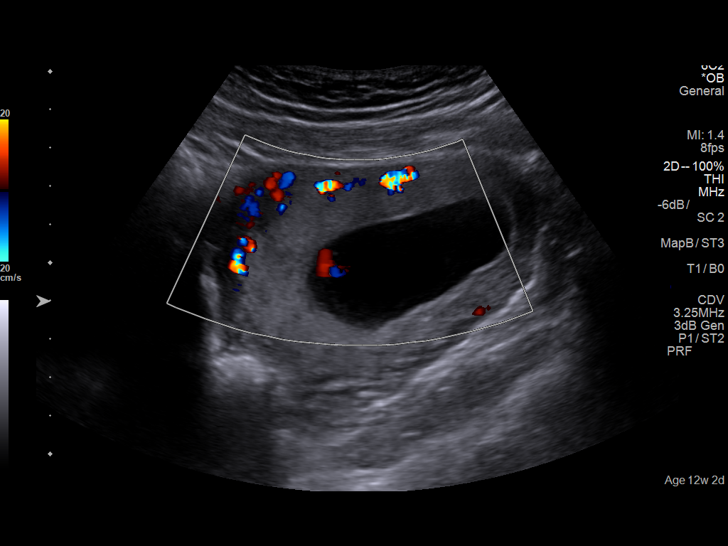
[im 13/112]
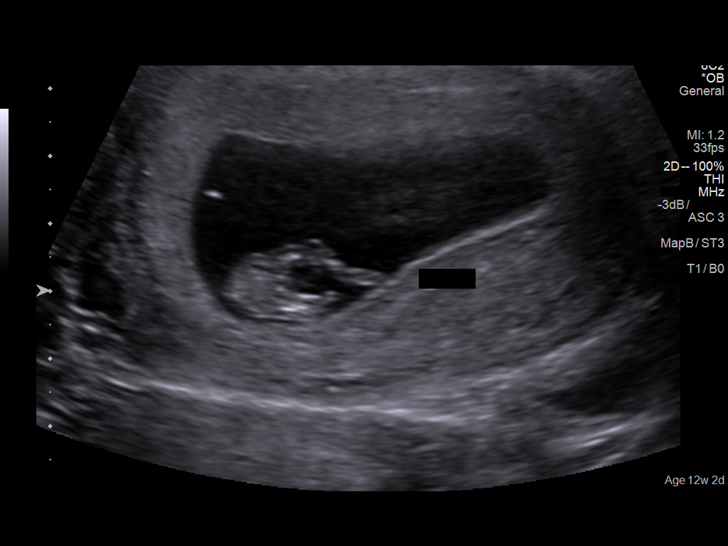
[im 21/112]
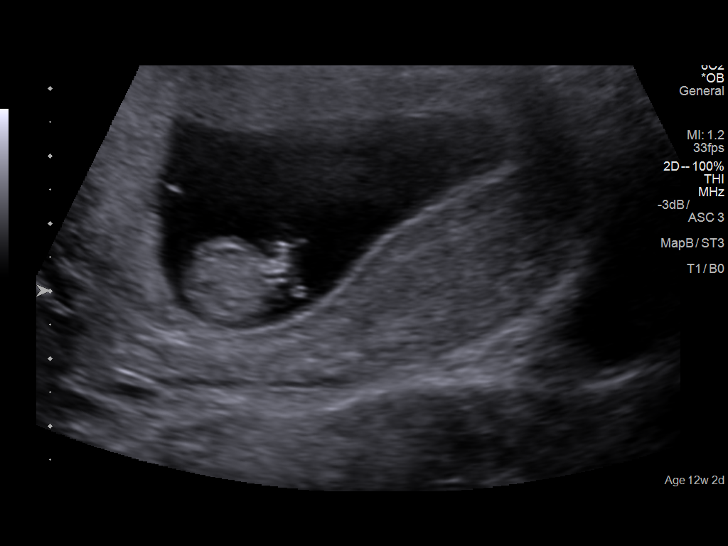
[im 29/112]
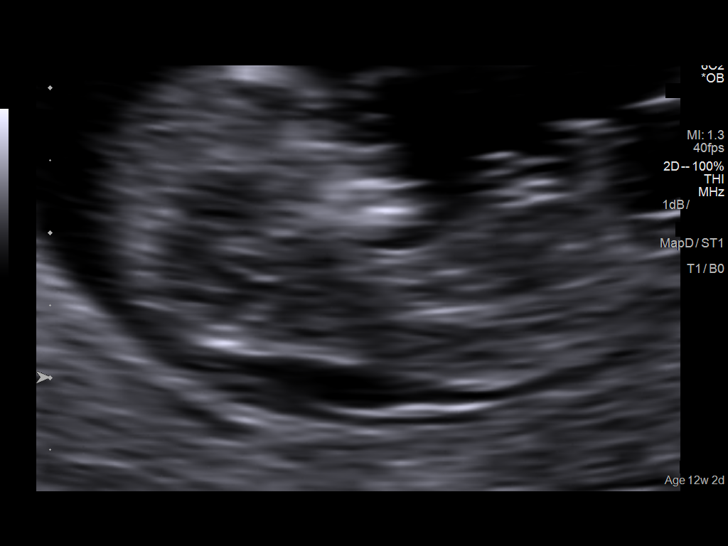
[im 38/112]
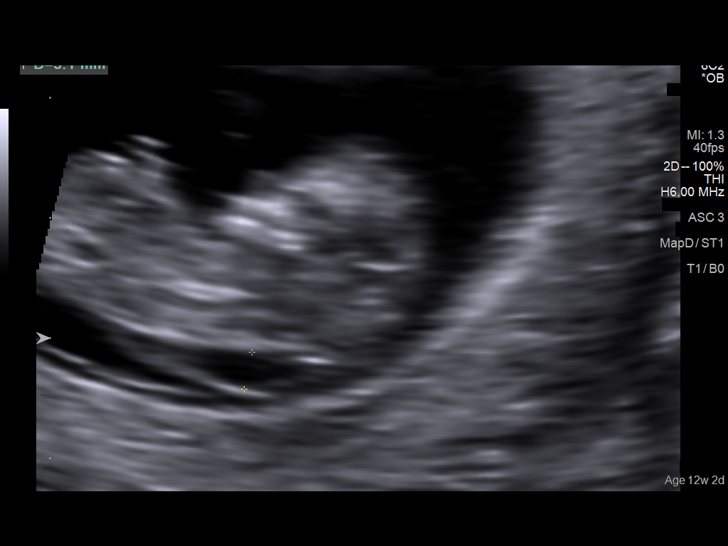
[im 46/112]
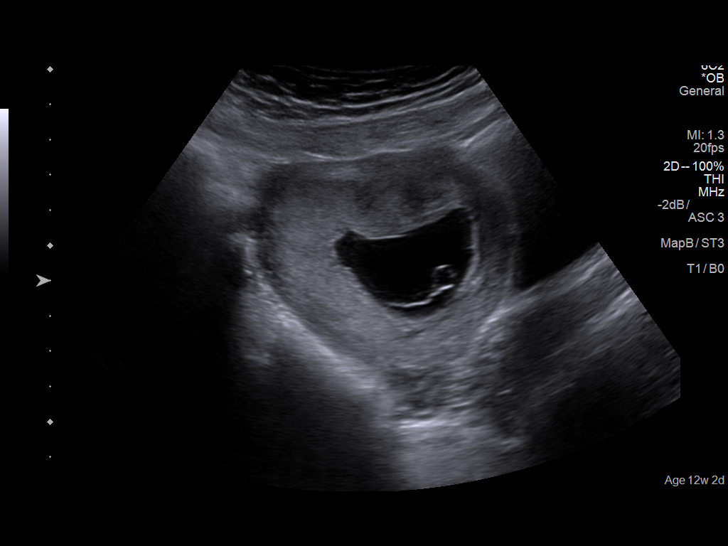
[im 58/112]
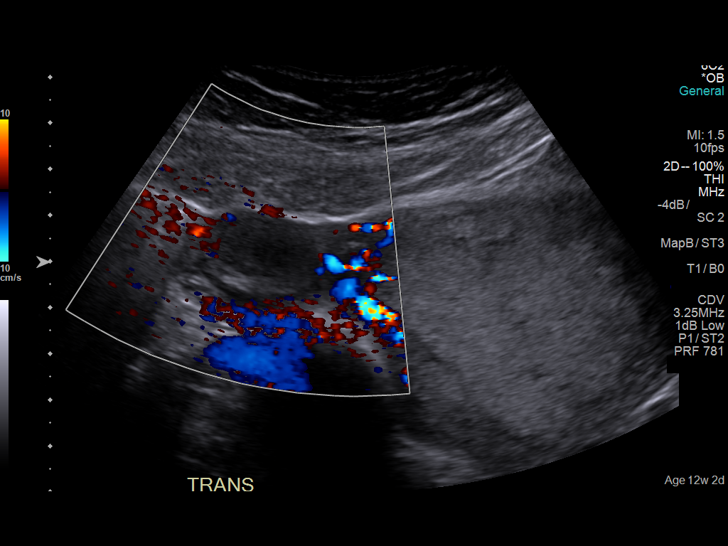
[im 66/112]
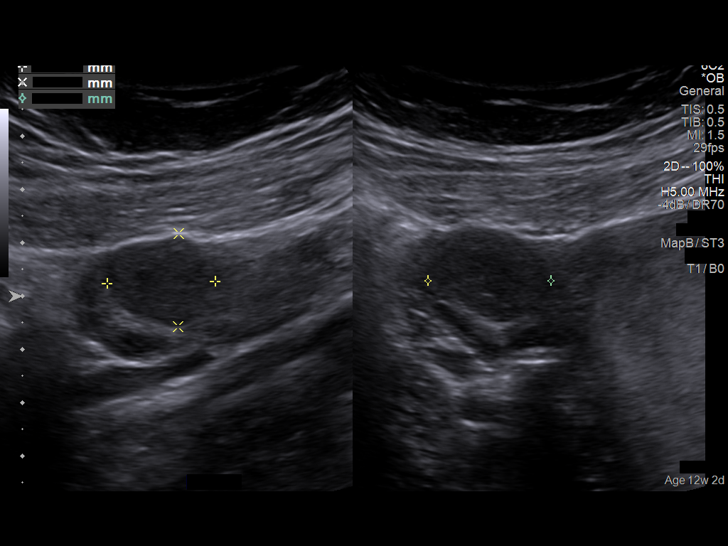
[im 75/112]
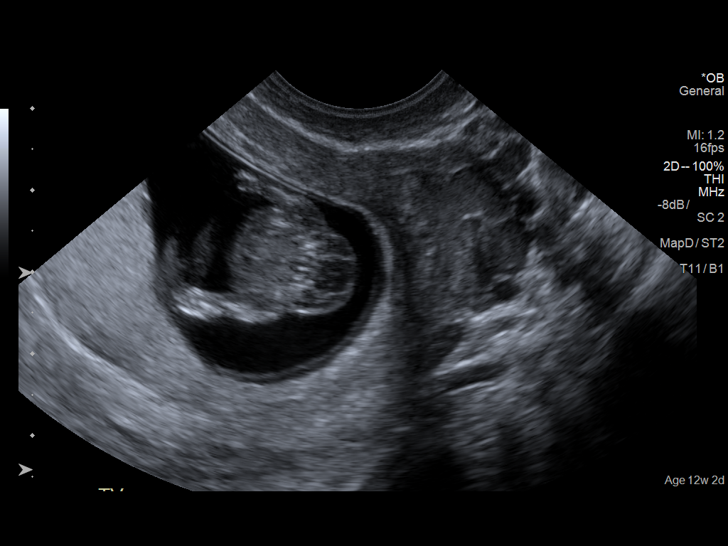
[im 83/112]
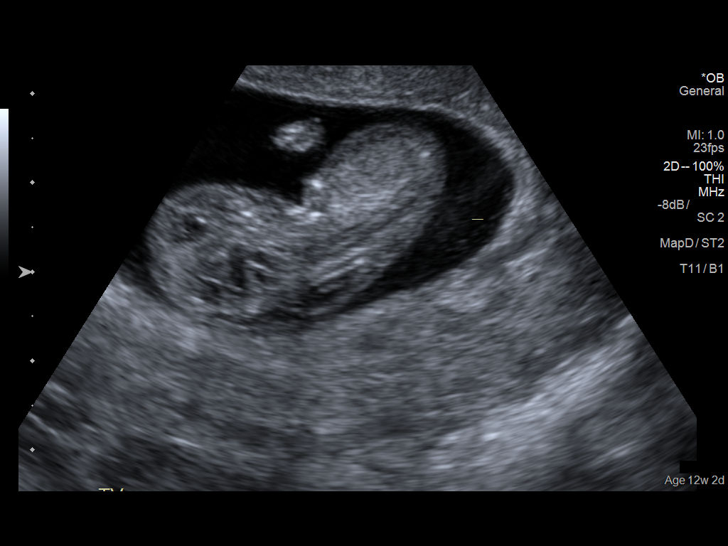
[im 91/112]
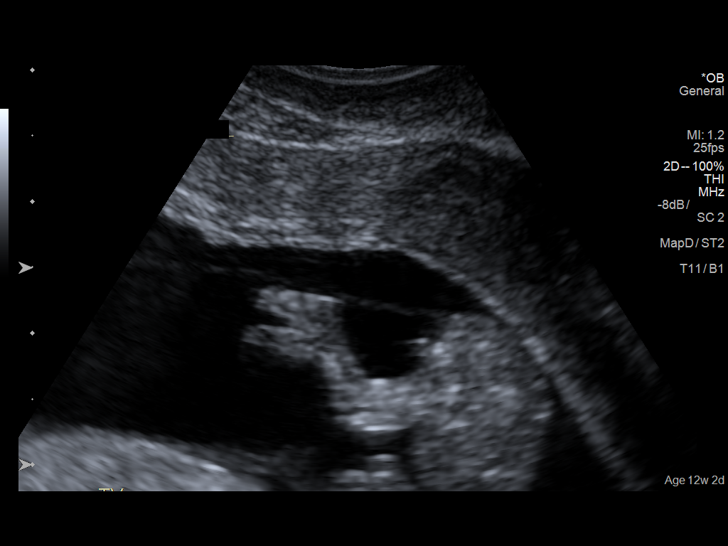
[im 99/112]
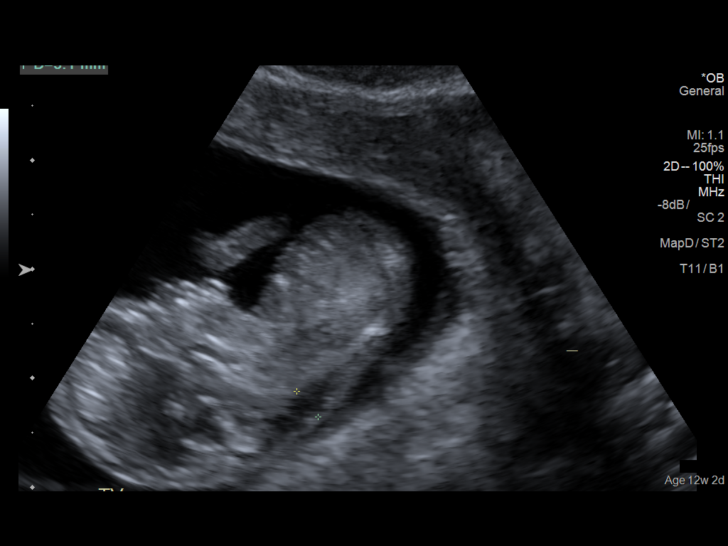
[im 107/112]
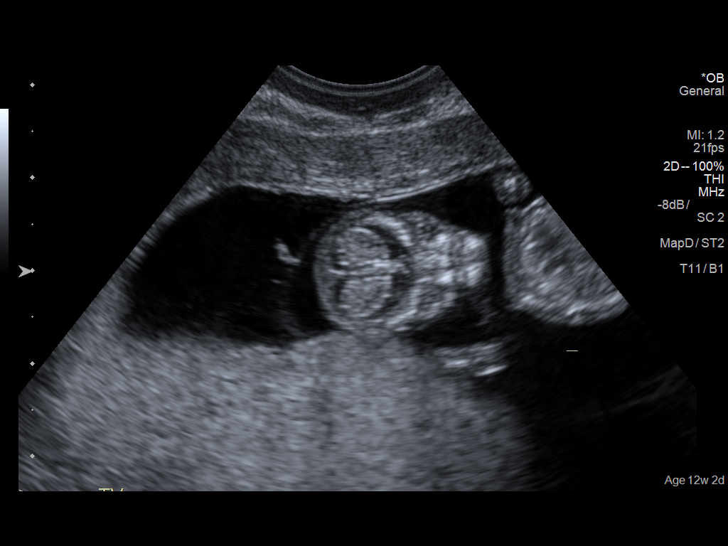

[13 of 28 positions shown; findings below may reference images not displayed]

IMPRESSION: Dear MS. Saharudin,

Thank you for referring your patient for  first trimester nuchal
translucency screening .

A singleton gestation is noted.

The fetal CRL is consistent with 11 w 1d and a EDC of
11/23/15 .

The CRL of 43.1 mm was too small for first trimester screen -
however the posterior neck area appeared more edematous
than normal with a measurement of 3.1 mm - this may extend
to the chest .

The ovaries were WNL- there is a small right ovarian cyst c/w
coprus luteum .

Due to nuchal edema - pt was referred to [facility] for
further evaluation and consideration of diagnostic testing.

## 2017-01-26 IMAGING — US US MFM OB DETAIL+14 WK
1 series · 12 of 28 positions shown · non-contrast
Comparison: none

PATIENT INFO:

KIKI
PERFORMED BY:
SERVICE(S) PROVIDED:
INDICATIONS:
20 weeks gestation of pregnancy
FETAL EVALUATION:
Num Of Fetuses:     1
Fetal Heart         140
Rate(bpm):
Cardiac Activity:   Present
Presentation:       Vertex
Placenta:           Posterior Grade 0, No previa
P. Cord Insertion:  Normal
Largest Pocket(cm)
4.3
BIOMETRY:
BPD:      44.7  mm     G. Age:  19w 4d         24  %    CI:        71.74   %    70 - 86
FL/HC:       18.7  %    16.8 -
HC:       168   mm     G. Age:  19w 4d         15  %
FL/BPD:      70.2  %
FL:       31.4  mm     G. Age:  19w 5d         29  %
HUM:      30.1  mm     G. Age:  20w 0d         46  %
GESTATIONAL AGE:
LMP:           21w 2d        Date:  02/08/16                 EDD:   11/14/16
U/S Today:     19w 4d                                        EDD:   11/26/16
Best:          20w 1d     Det. By:  U/S C R L  (05/04/16)    EDD:   11/22/16
ANATOMY:
Cavum:                 CSP visualized         Aortic Arch:            Normal appearance
Ventricles:            Normal appearance      Ductal Arch:            Normal appearance
Choroid Plexus:        Within Normal Limits   Diaphragm:              Within Normal Limits
Cerebellum:            Within Normal Limits   Stomach:                Seen
Posterior Fossa:       Within Normal Limits   Abdomen:                Within Normal
Limits
Nuchal Fold:           Within Normal Limits   Abdominal Wall:         Normal appearance
Face:                  Orbits visualized      Cord Vessels:           3 vessels
Lips:                  Normal appearance      Kidneys:                Normal appearance
Thoracic:              Within Normal Limits   Bladder:                Seen
Heart:                 4-Chamber view         Spine:                  Normal appearance
appears normal
RVOT:                  Normal appearance      Upper Extremities:      Visualized
LVOT:                  Normal appearance      Lower Extremities:      Visualized
CERVIX UTERUS ADNEXA:
Cervix
Length:           3.85  cm.
Right Ovary
Size(cm)     2.74   x   2.41   x  2.77      Vol(ml):
Comment:      Left ovary not visualized. left adnexa appears
WNL.

[Series 1: us mfm ob detail+14 wk · 0.23mm/px · 12 of 76 slices shown]
[im 3/76]
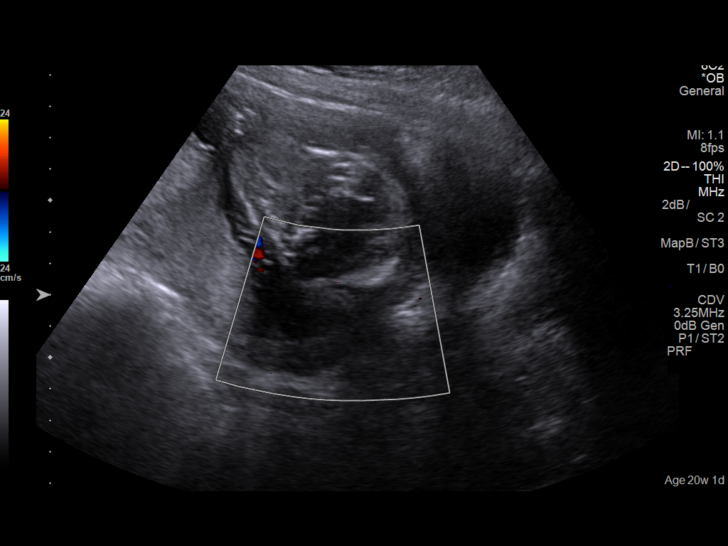
[im 9/76]
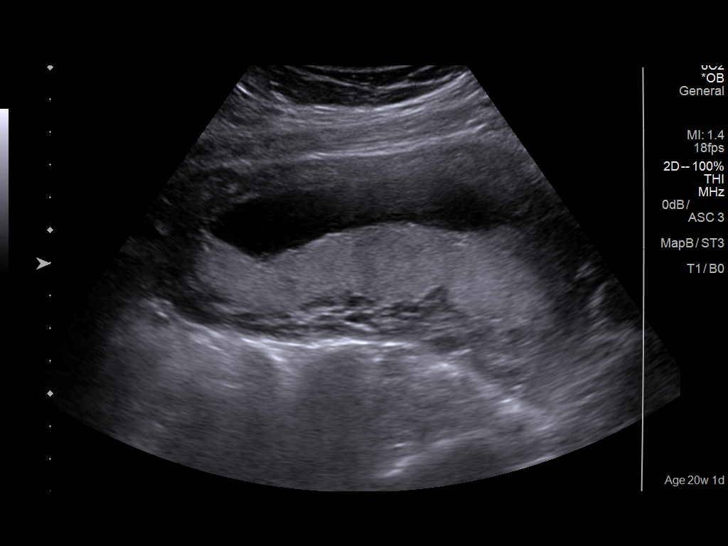
[im 14/76]
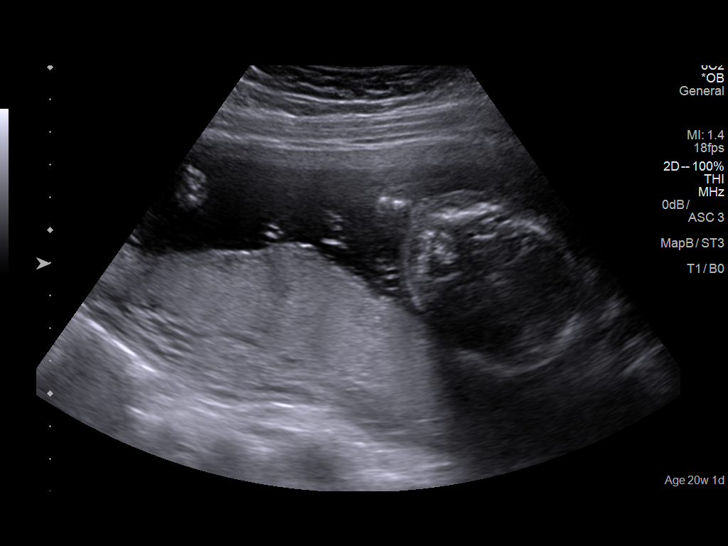
[im 23/76]
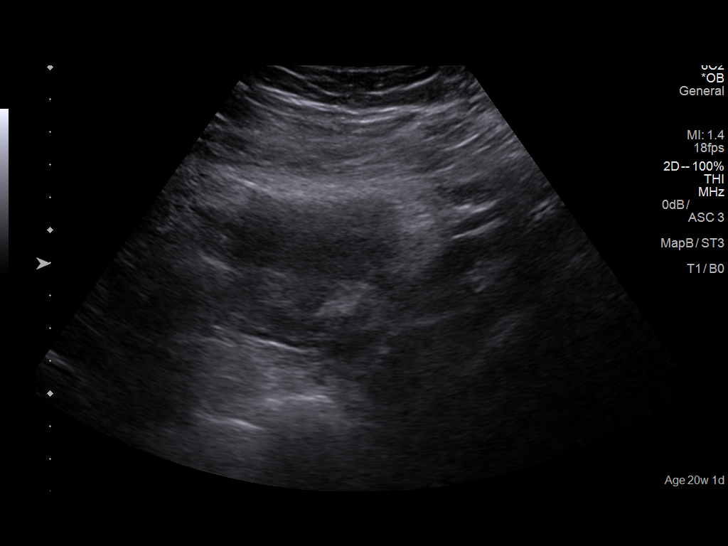
[im 28/76]
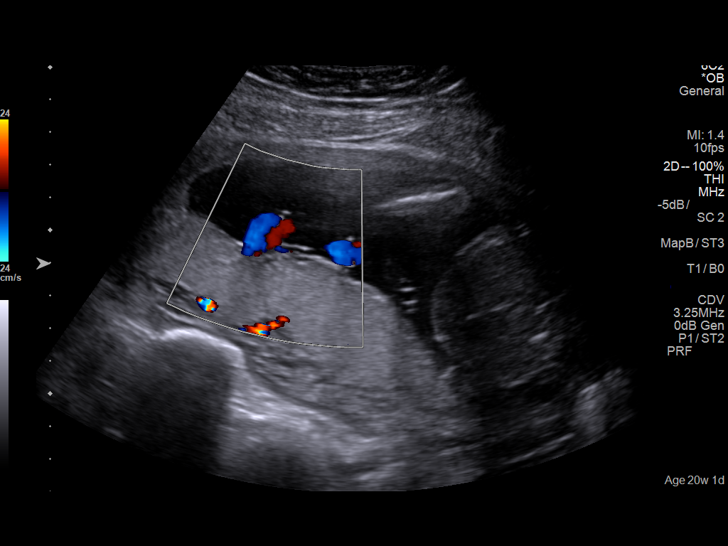
[im 34/76]
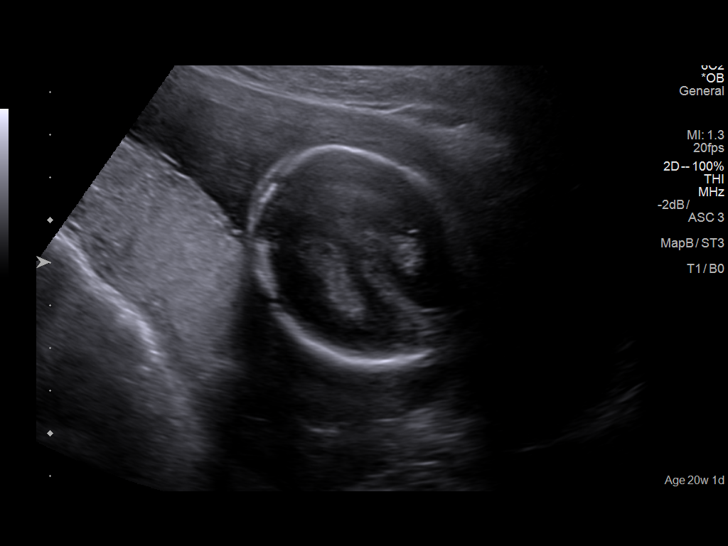
[im 42/76]
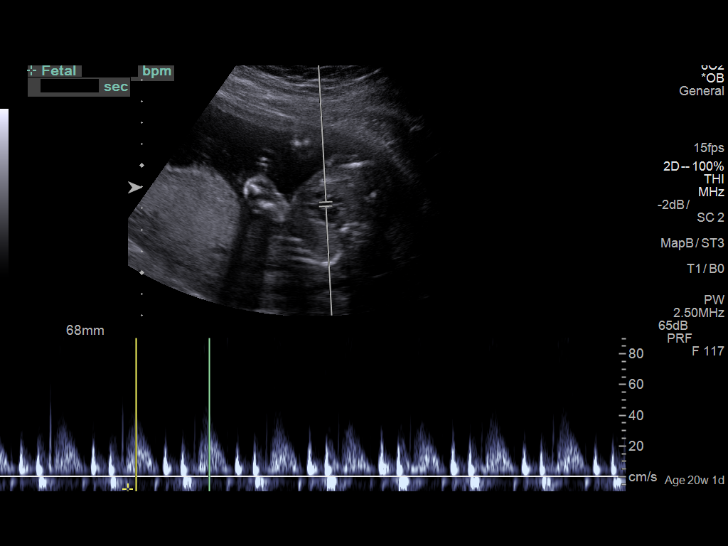
[im 48/76]
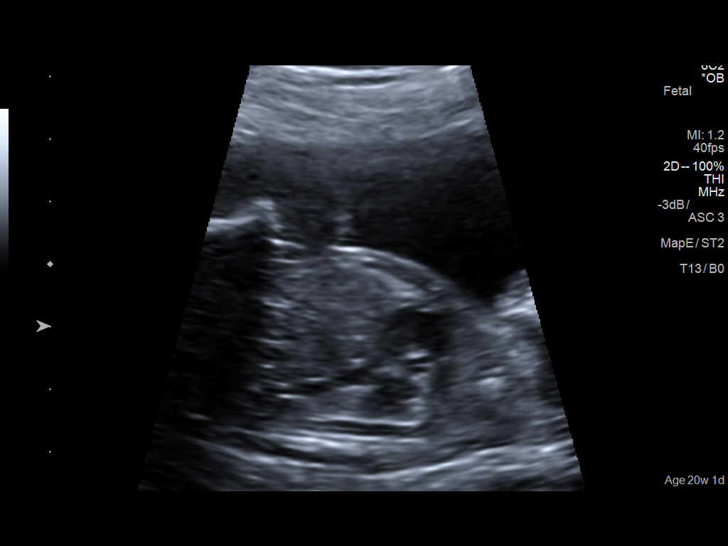
[im 53/76]
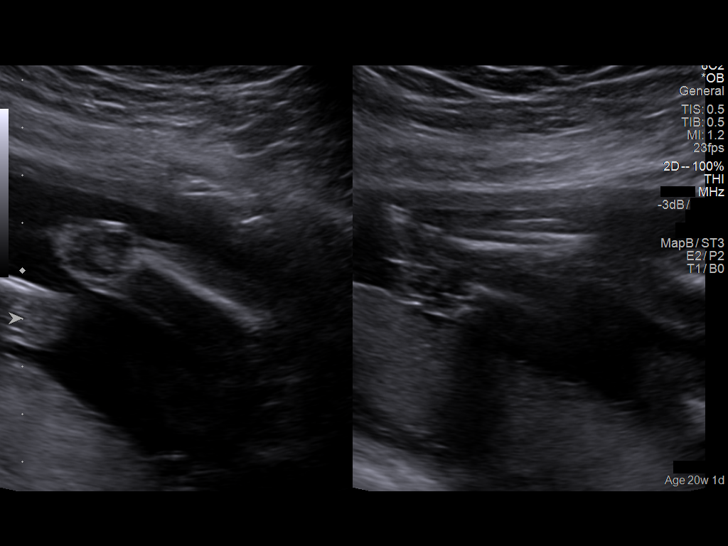
[im 62/76]
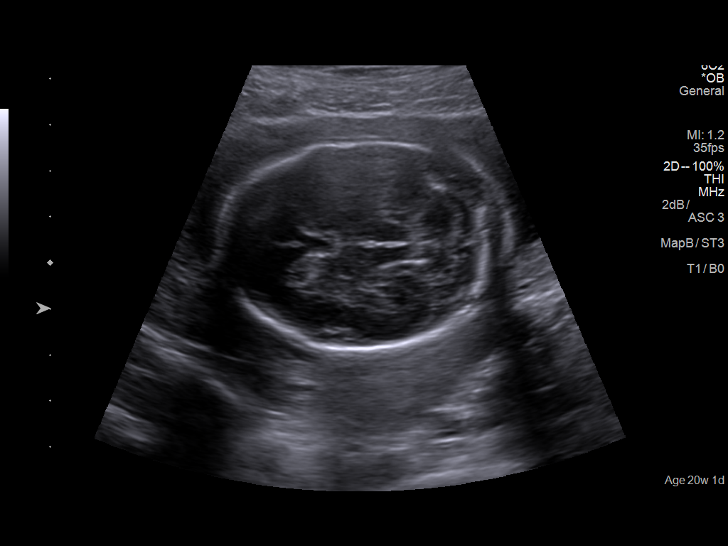
[im 67/76]
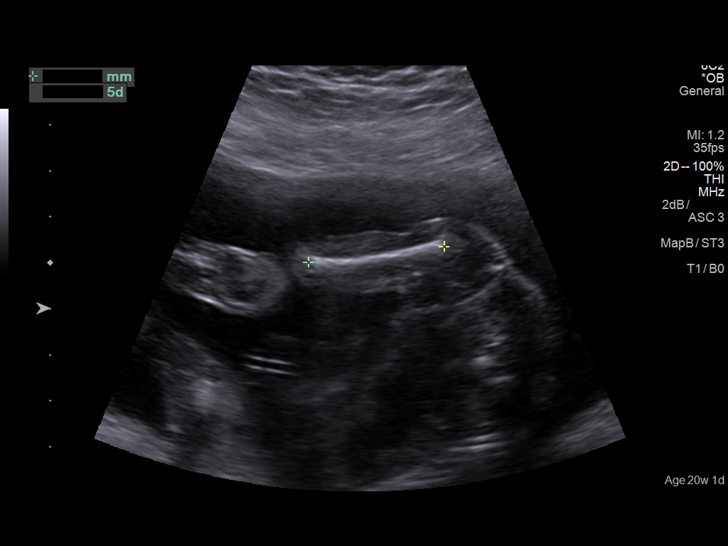
[im 73/76]
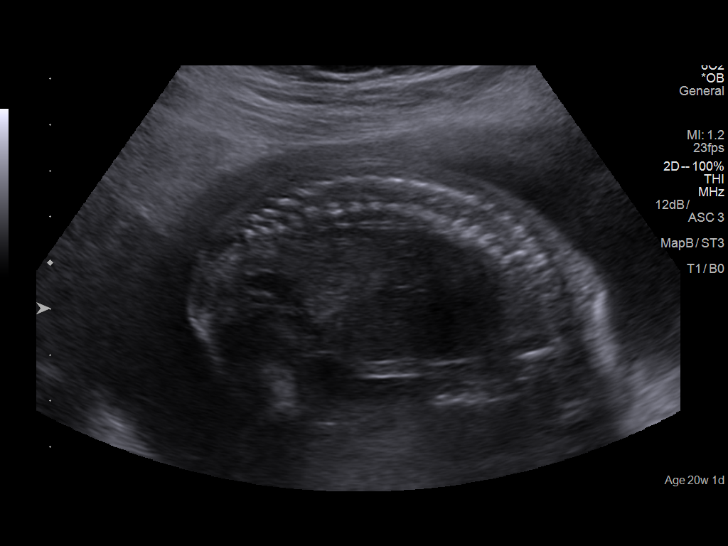

[12 of 28 positions shown; findings below may reference images not displayed]

IMPRESSION: Dear Ms.   Boll,

Thank you for referring your patient to [facility] for a
fetal anatomical survey.  She was originally referred to [facility]] for routine first trimester screening on
05/04/2016, at which time she was found to be too early for
screening, but an ultrasound showed a thickened nuchal
translucency (3.1 mm).  She met with our genetic counselor
and was offered first trimester ultrasound at [facility]
and cell free fetal DNA, which she declined.

Today here is a singleton gestation at at 30w3d gestation by
US performed 05/04/2016 at [facility] [HOSPITAL], at
which time she was 99w9d gestation.

The fetal biometry correlates with established dating.

Detailed evaluation of the fetal anatomy was performed.  The
fetal anatomical survey appears within normal limits.  Nuchal
fold measures 5.0 mm (the upper limit of normal is 6.0 mm).

Although no signs of aneuploidy were visualized, it must be
noted that a normal ultrasound cannot rule out aneuploidy.

The left ovary was not visualized today, but both ovaries were
visualized 05/04/2016 and appeared normal.

Thank you for allowing us to participate in your patient's care.

assistance.

Gizel Lom

## 2017-03-09 ENCOUNTER — Encounter (HOSPITAL_COMMUNITY): Payer: Self-pay

## 2019-04-17 ENCOUNTER — Ambulatory Visit
Admission: EM | Admit: 2019-04-17 | Discharge: 2019-04-17 | Disposition: A | Discharge status: Home / Self Care | Payer: Medicaid Other | Attending: Family Medicine | Admitting: Family Medicine

## 2019-04-17 ENCOUNTER — Encounter: Payer: Self-pay | Admitting: Emergency Medicine

## 2019-04-17 ENCOUNTER — Other Ambulatory Visit: Payer: Self-pay

## 2019-04-17 DIAGNOSIS — R300 Dysuria: Secondary | ICD-10-CM

## 2019-04-17 DIAGNOSIS — R3915 Urgency of urination: Secondary | ICD-10-CM | POA: Diagnosis not present

## 2019-04-17 DIAGNOSIS — R319 Hematuria, unspecified: Secondary | ICD-10-CM | POA: Insufficient documentation

## 2019-04-17 DIAGNOSIS — N390 Urinary tract infection, site not specified: Secondary | ICD-10-CM | POA: Insufficient documentation

## 2019-04-17 LAB — URINALYSIS, COMPLETE (UACMP) WITH MICROSCOPIC: RBC / HPF: >50 RBC/hpf (ref 0–5)

## 2019-04-17 MED ORDER — CEPHALEXIN 500 MG PO CAPS: 500.0000 mg | ORAL_CAPSULE | Freq: Two times a day (BID) | ORAL | 0 refills | Status: AC

## 2019-04-17 NOTE — Discharge Instructions (Addendum)
Take medication as prescribed. Drink plenty of fluids.  ° °Follow up with your primary care physician this week as needed. Return to Urgent care for new or worsening concerns.  ° °

## 2019-04-17 NOTE — ED Triage Notes (Signed)
Pt c/o dysuria, and urinary frequency. Started about 3 days ago. She thought she was better yesterday but came back this morning. Denies lowe back pain or fever.

## 2019-04-17 NOTE — ED Provider Notes (Signed)
[facility] ____________________________________________  Time seen: Approximately 9:02 AM  I have reviewed the triage vital signs and the nursing notes.   HISTORY  Chief Complaint Dysuria   HPI Monica Stevens is a 28 y.o. female presenting for evaluation of urinary frequency, urinary urgency and some discomfort with urination.  States symptoms been present for 3 days, was getting better yesterday with over-the-counter Azo but then worsened again this morning prompted her to come in.  States burning and irritation with each void.  Denies accompanying fevers, vomiting, diarrhea, back pain, flank pain or atypical vaginal discharge.  Denies concerns of STDs.  Denies pain at this time.  Continues to eat and drink well.  States she believes the trigger may be that she has not been drinking enough water.  Denies current pregnancy.  No recent sickness.  Reports otherwise doing well.  Patient's last menstrual period was 03/29/2019 (approximate).   History reviewed. No pertinent past medical history.  Patient Active Problem List   Diagnosis Date Noted  . Pregnancy 11/20/2016  . Labor and delivery, indication for care 11/16/2016  . Increased nuchal translucency space on fetal ultrasound 05/04/2016    Past Surgical History:  Procedure Laterality Date  . NO PAST SURGERIES       No current facility-administered medications for this encounter.   Current Outpatient Medications:  .  cephALEXin (KEFLEX) 500 MG capsule, Take 1 capsule (500 mg total) by mouth 2 (two) times daily for 7 days., Disp: 14 capsule, Rfl: 0  Allergies Patient has no known allergies.  Family History  Problem Relation Age of Onset  . Healthy Mother   . Healthy Father     Social History Social History   Tobacco Use  . Smoking status: Never Smoker  . Smokeless tobacco: Never Used  Substance Use Topics  . Alcohol use: No  . Drug use: No    Review of Systems Constitutional: No fever  Cardiovascular: Denies chest pain. Respiratory: Denies shortness of breath. Gastrointestinal: No abdominal pain.  No nausea, no vomiting.  No diarrhea. Genitourinary: Positive for dysuria. Musculoskeletal: Negative for back pain. Skin: Negative for rash.   ____________________________________________   PHYSICAL EXAM:  VITAL SIGNS: ED Triage Vitals  Enc Vitals Group     BP 04/17/19 0839 115/67     Pulse Rate 04/17/19 0839 82     Resp 04/17/19 0839 18     Temp 04/17/19 0839 98.5 F (36.9 C)     Temp Source 04/17/19 0839 Oral     SpO2 04/17/19 0839 100 %     Weight 04/17/19 0838 155 lb (70.3 kg)     Height 04/17/19 0838 5\' 1"  (1.549 m)     Head Circumference --      Peak Flow --      Pain Score 04/17/19 0838 0     Pain Loc --      Pain Edu? --      Excl. in GC? --     Constitutional: Alert and oriented. Well appearing and in no acute distress. Eyes: Conjunctivae are normal. ENT      Head: Normocephalic and atraumatic. Cardiovascular: Normal rate, regular rhythm. Grossly normal heart sounds.  Good peripheral circulation. Respiratory: Normal respiratory effort without tachypnea nor retractions. Breath sounds are clear and equal bilaterally. No wheezes, rales, rhonchi. Gastrointestinal: Soft and nontender. No CVA tenderness. Musculoskeletal:  No midline cervical, thoracic or lumbar tenderness to palpation. Neurologic:  Normal speech and language.  Skin:  Skin is warm, dry  and intact. No rash noted. Psychiatric: Mood and affect are normal. Speech and behavior are normal. Patient exhibits appropriate insight and judgment   ___________________________________________   LABS (all labs ordered are listed, but only abnormal results are displayed)  Labs Reviewed  URINALYSIS, COMPLETE (UACMP) WITH MICROSCOPIC - Abnormal; Notable for the following components:      Result Value   Color, Urine ORANGE (*)    APPearance CLOUDY (*)    Glucose, UA   (*)    Value: TEST NOT REPORTED  DUE TO COLOR INTERFERENCE OF URINE PIGMENT   Hgb urine dipstick   (*)    Value: TEST NOT REPORTED DUE TO COLOR INTERFERENCE OF URINE PIGMENT   Bilirubin Urine   (*)    Value: TEST NOT REPORTED DUE TO COLOR INTERFERENCE OF URINE PIGMENT   Ketones, ur   (*)    Value: TEST NOT REPORTED DUE TO COLOR INTERFERENCE OF URINE PIGMENT   Protein, ur   (*)    Value: TEST NOT REPORTED DUE TO COLOR INTERFERENCE OF URINE PIGMENT   Nitrite   (*)    Value: TEST NOT REPORTED DUE TO COLOR INTERFERENCE OF URINE PIGMENT   Leukocytes,Ua   (*)    Value: TEST NOT REPORTED DUE TO COLOR INTERFERENCE OF URINE PIGMENT   Bacteria, UA RARE (*)    All other components within normal limits  URINE CULTURE   ____________________________________________   PROCEDURES Procedures    INITIAL IMPRESSION / ASSESSMENT AND PLAN / ED COURSE  Pertinent labs & imaging results that were available during my care of the patient were reviewed by me and considered in my medical decision making (see chart for details).  Well-appearing patient.  No acute distress.  Urinalysis reviewed, suspect UTI.  We will culture.  Will empirically start on oral Keflex.  Encourage rest, fluids, supportive care.Discussed indication, risks and benefits of medications with patient.  Discussed follow up with Primary care physician this week. Discussed follow up and return parameters including no resolution or any worsening concerns. Patient verbalized understanding and agreed to plan.   ____________________________________________   FINAL CLINICAL IMPRESSION(S) / ED DIAGNOSES  Final diagnoses:  Urinary tract infection with hematuria, site unspecified     ED Discharge Orders         Ordered    cephALEXin (KEFLEX) 500 MG capsule  2 times daily     04/17/19 0912           Note: This dictation was prepared with Dragon dictation along with smaller phrase technology. Any transcriptional errors that result from this process are  unintentional.         Marylene Land, NP 04/17/19 [phone]

## 2019-04-18 LAB — URINE CULTURE: Culture: NO GROWTH
